# Patient Record
Sex: Male | Born: 1995 | Race: White | Hispanic: No | Marital: Single | Smoking: Current every day smoker
Health system: Southern US, Community
[De-identification: ages and names within clinical notes are randomized; demographics above are authoritative.]

## PROBLEM LIST (undated history)

## (undated) DIAGNOSIS — E785 Hyperlipidemia, unspecified: Secondary | ICD-10-CM

## (undated) DIAGNOSIS — J45909 Unspecified asthma, uncomplicated: Secondary | ICD-10-CM

## (undated) HISTORY — PX: OTHER SURGICAL HISTORY: SHX169

## (undated) HISTORY — PX: MOUTH SURGERY: SHX715

---

## 2014-08-09 ENCOUNTER — Emergency Department (INDEPENDENT_AMBULATORY_CARE_PROVIDER_SITE_OTHER): Payer: BC Managed Care – PPO

## 2014-08-09 ENCOUNTER — Emergency Department
Admission: EM | Admit: 2014-08-09 | Discharge: 2014-08-09 | Disposition: A | Discharge status: Home / Self Care | Payer: BC Managed Care – PPO | Source: Home / Self Care | Attending: Family Medicine | Admitting: Family Medicine

## 2014-08-09 ENCOUNTER — Encounter: Payer: Self-pay | Admitting: *Deleted

## 2014-08-09 DIAGNOSIS — R05 Cough: Secondary | ICD-10-CM | POA: Diagnosis not present

## 2014-08-09 DIAGNOSIS — R059 Cough, unspecified: Secondary | ICD-10-CM

## 2014-08-09 DIAGNOSIS — J189 Pneumonia, unspecified organism: Secondary | ICD-10-CM

## 2014-08-09 DIAGNOSIS — R0602 Shortness of breath: Secondary | ICD-10-CM | POA: Diagnosis not present

## 2014-08-09 DIAGNOSIS — R0600 Dyspnea, unspecified: Secondary | ICD-10-CM

## 2014-08-09 HISTORY — DX: Hyperlipidemia, unspecified: E78.5

## 2014-08-09 MED ORDER — BENZONATATE 200 MG PO CAPS: 200.0000 mg | ORAL_CAPSULE | Freq: Every day | ORAL | Status: DC

## 2014-08-09 MED ORDER — ALBUTEROL SULFATE HFA 108 (90 BASE) MCG/ACT IN AERS: 2.0000 | INHALATION_SPRAY | RESPIRATORY_TRACT | Status: DC | PRN

## 2014-08-09 MED ORDER — IPRATROPIUM-ALBUTEROL 0.5-2.5 (3) MG/3ML IN SOLN
3.0000 mL | Freq: Once | RESPIRATORY_TRACT | Status: AC
  Administered 2014-08-09: 3 mL via RESPIRATORY_TRACT

## 2014-08-09 MED ORDER — PREDNISONE 20 MG PO TABS: 20.0000 mg | ORAL_TABLET | Freq: Two times a day (BID) | ORAL | Status: DC

## 2014-08-09 MED ORDER — LEVOFLOXACIN 500 MG PO TABS: 500.0000 mg | ORAL_TABLET | Freq: Every day | ORAL | Status: DC

## 2014-08-09 NOTE — ED Notes (Signed)
Pt c/o SOB, congestion, sinus HA, and nonproductive cough x 4 days. Denies fever.

## 2014-08-09 NOTE — ED Provider Notes (Signed)
CSN: [id number]     Arrival date & time 08/09/14  1547 History   First MD Initiated Contact with Patient 08/09/14 1613     Chief Complaint  Patient presents with  . Cough  . Shortness of Breath      HPI Comments: Four to five days ago patient developed sinus congestion, fatigue, and myalgias.  He developed a non-productive cough two days ago, with wheezing and shortness of breath.  Today he developed a mild sore throat.  His cough is worse at night. He has a past history of asthma.  He continues to smoke.  The history is provided by the patient.    Past Medical History  Diagnosis Date  . Hyperlipidemia    Past Surgical History  Procedure Laterality Date  . Mouth surgery    . Ingrown toenail removal      x 6   Family History  Problem Relation Age of Onset  . Diabetes Mother    History  Substance Use Topics  . Smoking status: Current Every Day Smoker -- 1.00 packs/day    Types: Cigarettes  . Smokeless tobacco: Not on file  . Alcohol Use: No    Review of Systems + sore throat + cough No pleuritic pain + wheezing + nasal congestion + post-nasal drainage No sinus pain/pressure No itchy/red eyes No earache No hemoptysis + SOB + fever, + chills + nausea No vomiting No abdominal pain No diarrhea No urinary symptoms No skin rash + fatigue + myalgias + headache Used OTC meds without relief  Allergies  Review of patient's allergies indicates no known allergies.  Home Medications   Prior to Admission medications   Medication Sig Start Date End Date Taking? Authorizing Provider  albuterol (PROVENTIL HFA;VENTOLIN HFA) 108 (90 BASE) MCG/ACT inhaler Inhale 2 puffs into the lungs every 4 (four) hours as needed for wheezing or shortness of breath. 08/09/14   Lattie HawStephen A Beese, MD  benzonatate (TESSALON) 200 MG capsule Take 1 capsule (200 mg total) by mouth at bedtime. Take as needed for cough 08/09/14   Lattie HawStephen A Beese, MD  levofloxacin (LEVAQUIN) 500 MG tablet Take 1  tablet (500 mg total) by mouth daily. 08/09/14   Lattie HawStephen A Beese, MD  predniSONE (DELTASONE) 20 MG tablet Take 1 tablet (20 mg total) by mouth 2 (two) times daily. Take with food. 08/09/14   Lattie HawStephen A Beese, MD   BP 124/67 mmHg  Pulse 79  Temp(Src) 98.5 F (36.9 C) (Oral)  Resp 18  Wt 165 lb (74.844 kg)  SpO2 98% Physical Exam Nursing notes and Vital Signs reviewed. Appearance:  Patient appears healthy, stated age, and in no acute distress Eyes:  Pupils are equal, round, and reactive to light and accomodation.  Extraocular movement is intact.  Conjunctivae are not inflamed  Ears:  Canals normal.  Tympanic membranes normal.  Nose:  Mildly congested turbinates.  No sinus tenderness.  Pharynx:  Normal Neck:  Supple.   Tender enlarged posterior nodes are palpated bilaterally  Lungs:   Diffuse inspiratory/expiratory wheezes bilaterally.  Breath sounds are equal.  No respiratory distress.  Post neb treatment, decreased wheezes bilaterally Heart:  Regular rate and rhythm without murmurs, rubs, or gallops.  Abdomen:  Nontender without masses or hepatosplenomegaly.  Bowel sounds are present.  No CVA or flank tenderness.  Extremities:  No edema.  No calf tenderness Skin:  No rash present.   ED Course  Procedures  none    Imaging Review Dg Chest 2 View  08/09/2014   CLINICAL DATA:  Cough and shortness of breath. Chills. Initial encounter.  EXAM: CHEST  2 VIEW  COMPARISON:  None.  FINDINGS: Patchy right infrahilar opacity suggests pneumonia. Left lung is clear. No pleural effusion. The cardiopericardial silhouette is within normal limits for size. Imaged bony structures of the thorax are intact.  IMPRESSION: Patchy right infrahilar airspace disease suggests pneumonia.   Electronically Signed   By: Kennith CenterEric  Mansell M.D.   On: 08/09/2014 16:59     MDM   1. Pneumonia involving right lung, unspecified part of lung   2. Cough   3. Dyspnea    Administered DuoNeb by hand held nebulizer. Begin  Levaquin 500mg  daily for 10 days.  Prednisone burst.  Rx for albuterol MDI.  Prescription written for Benzonatate [facility](Tessalon) to take at bedtime for night-time cough.  Take plain Mucinex (1200 mg guaifenesin) twice daily for cough and congestion.  May add Sudafed for sinus congestion.   Increase fluid intake, rest. May use Afrin nasal spray (or generic oxymetazoline) twice daily for about 5 days.  Also recommend using saline nasal spray several times daily and saline nasal irrigation (AYR is a common brand) Try warm salt water gargles for sore throat.  Stop all antihistamines for now, and other non-prescription cough/cold preparations. If symptoms become significantly worse during the night or over the weekend, proceed to the local emergency room.  Followup with Family Doctor in 2 weeks for repeat chest x-ray    Lattie HawStephen A Beese, MD 08/11/14 1017

## 2014-08-09 NOTE — Discharge Instructions (Signed)
Take plain Mucinex (1200 mg guaifenesin) twice daily for cough and congestion.  May add Sudafed for sinus congestion.   Increase fluid intake, rest. May use Afrin nasal spray (or generic oxymetazoline) twice daily for about 5 days.  Also recommend using saline nasal spray several times daily and saline nasal irrigation (AYR is a common brand) Try warm salt water gargles for sore throat.  Stop all antihistamines for now, and other non-prescription cough/cold preparations. If symptoms become significantly worse during the night or over the weekend, proceed to the local emergency room.    Pneumonia Pneumonia is an infection of the lungs.  CAUSES Pneumonia may be caused by bacteria or a virus. Usually, these infections are caused by breathing infectious particles into the lungs (respiratory tract). SIGNS AND SYMPTOMS   Cough.  Fever.  Chest pain.  Increased rate of breathing.  Wheezing.  Mucus production. DIAGNOSIS  If you have the common symptoms of pneumonia, your health care provider will typically confirm the diagnosis with a chest X-ray. The X-ray will show an abnormality in the lung (pulmonary infiltrate) if you have pneumonia. Other tests of your blood, urine, or sputum may be done to find the specific cause of your pneumonia. Your health care provider may also do tests (blood gases or pulse oximetry) to see how well your lungs are working. TREATMENT  Some forms of pneumonia may be spread to other people when you cough or sneeze. You may be asked to wear a mask before and during your exam. Pneumonia that is caused by bacteria is treated with antibiotic medicine. Pneumonia that is caused by the influenza virus may be treated with an antiviral medicine. Most other viral infections must run their course. These infections will not respond to antibiotics.  HOME CARE INSTRUCTIONS   Cough suppressants may be used if you are losing too much rest. However, coughing protects you by clearing  your lungs. You should avoid using cough suppressants if you can.  Your health care provider may have prescribed medicine if he or she thinks your pneumonia is caused by bacteria or influenza. Finish your medicine even if you start to feel better.  Your health care provider may also prescribe an expectorant. This loosens the mucus to be coughed up.  Take medicines only as directed by your health care provider.  Do not smoke. Smoking is a common cause of bronchitis and can contribute to pneumonia. If you are a smoker and continue to smoke, your cough may last several weeks after your pneumonia has cleared.  A cold steam vaporizer or humidifier in your room or home may help loosen mucus.  Coughing is often worse at night. Sleeping in a semi-upright position in a recliner or using a couple pillows under your head will help with this.  Get rest as you feel it is needed. Your body will usually let you know when you need to rest. PREVENTION A pneumococcal shot (vaccine) is available to prevent a common bacterial cause of pneumonia. This is usually suggested for:  People over 19 years old.  Patients on chemotherapy.  People with chronic lung problems, such as bronchitis or emphysema.  People with immune system problems. If you are over 65 or have a high risk condition, you may receive the pneumococcal vaccine if you have not received it before. In some countries, a routine influenza vaccine is also recommended. This vaccine can help prevent some cases of pneumonia.You may be offered the influenza vaccine as part of your care. If  you smoke, it is time to quit. You may receive instructions on how to stop smoking. Your health care provider can provide medicines and counseling to help you quit. SEEK MEDICAL CARE IF: You have a fever. SEEK IMMEDIATE MEDICAL CARE IF:   Your illness becomes worse. This is especially true if you are elderly or weakened from any other disease.  You cannot control  your cough with suppressants and are losing sleep.  You begin coughing up blood.  You develop pain which is getting worse or is uncontrolled with medicines.  Any of the symptoms which initially brought you in for treatment are getting worse rather than better.  You develop shortness of breath or chest pain. MAKE SURE YOU:   Understand these instructions.  Will watch your condition.  Will get help right away if you are not doing well or get worse. Document Released: 08/23/2005 Document Revised: 01/07/2014 Document Reviewed: 11/12/2010 [facility] Patient Information 2015 [employer], [place]. This information is not intended to replace advice given to you by your health care provider. Make sure you discuss any questions you have with your health care provider.

## 2014-08-12 ENCOUNTER — Telehealth: Payer: Self-pay | Admitting: Emergency Medicine

## 2015-05-12 IMAGING — CR DG CHEST 2V
2 series · 2 of 2 positions shown · non-contrast
Comparison: None.

CLINICAL DATA: Cough and shortness of breath. Chills. Initial
encounter.

EXAM:
CHEST  2 VIEW

[view not recorded (1 of 2)]
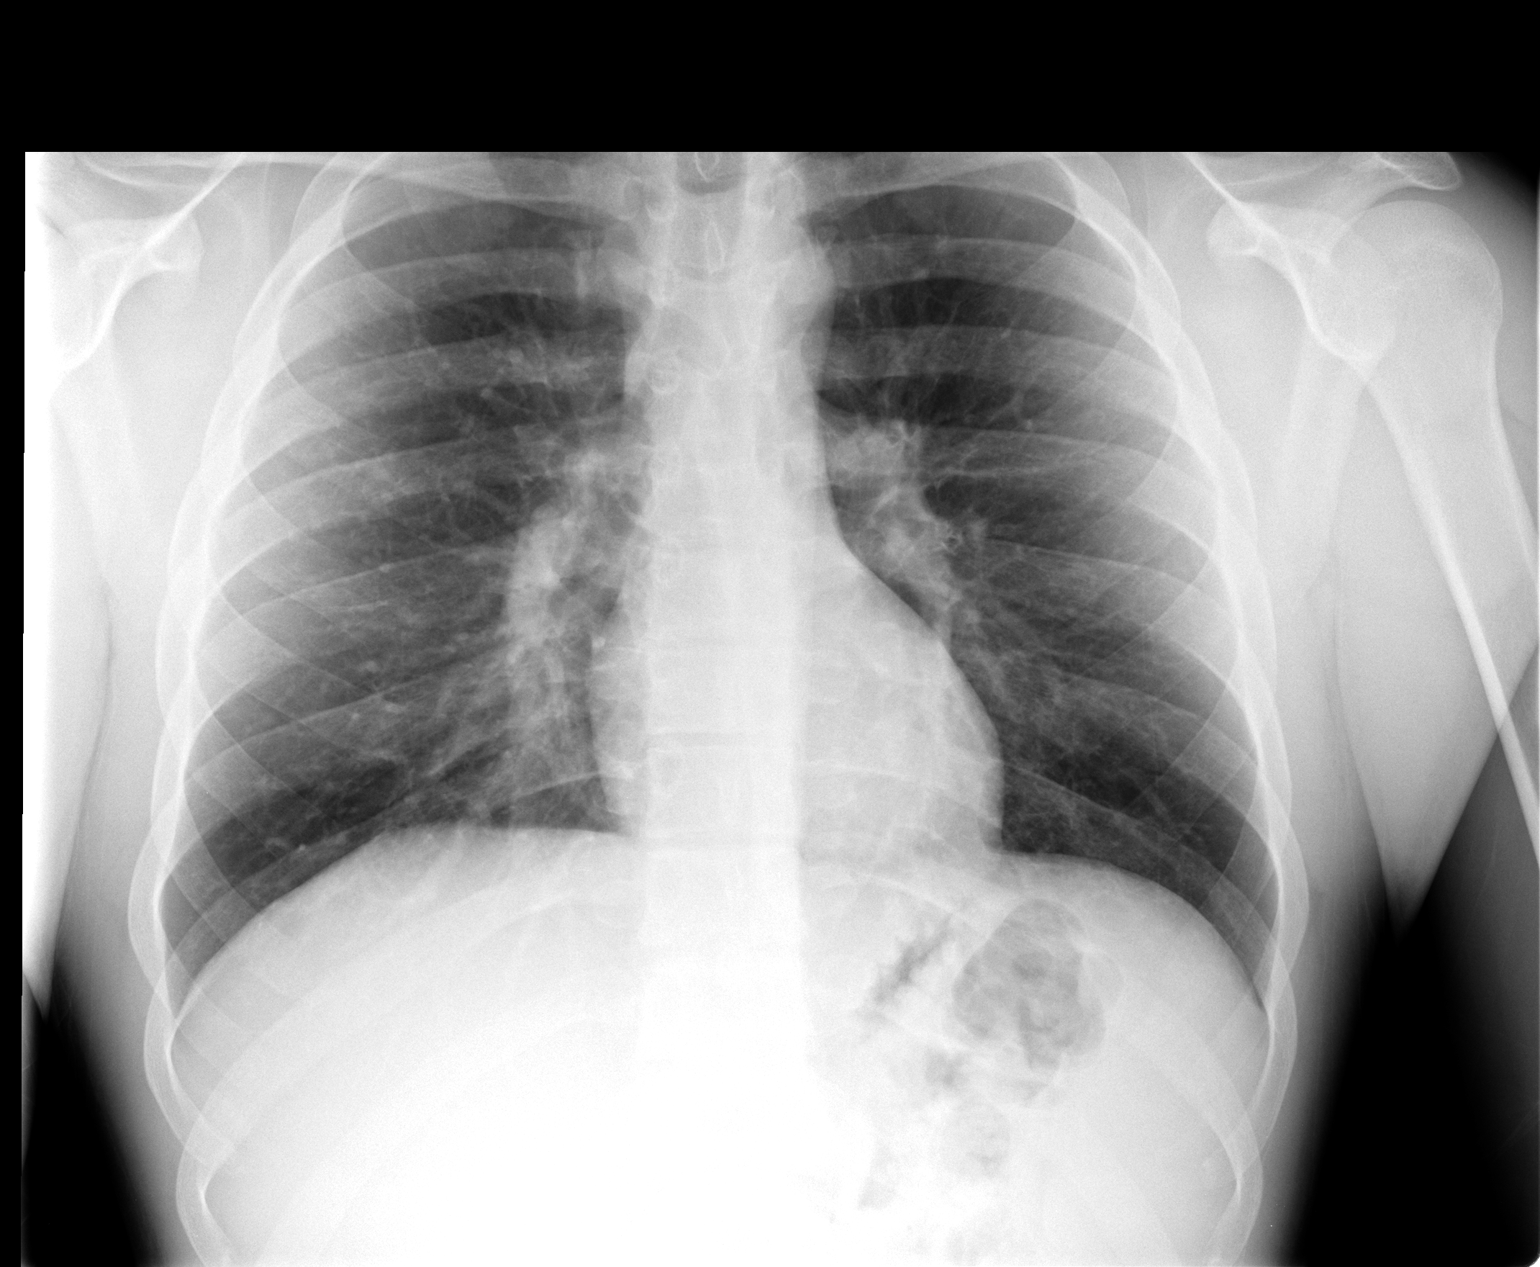

[view not recorded (2 of 2)]
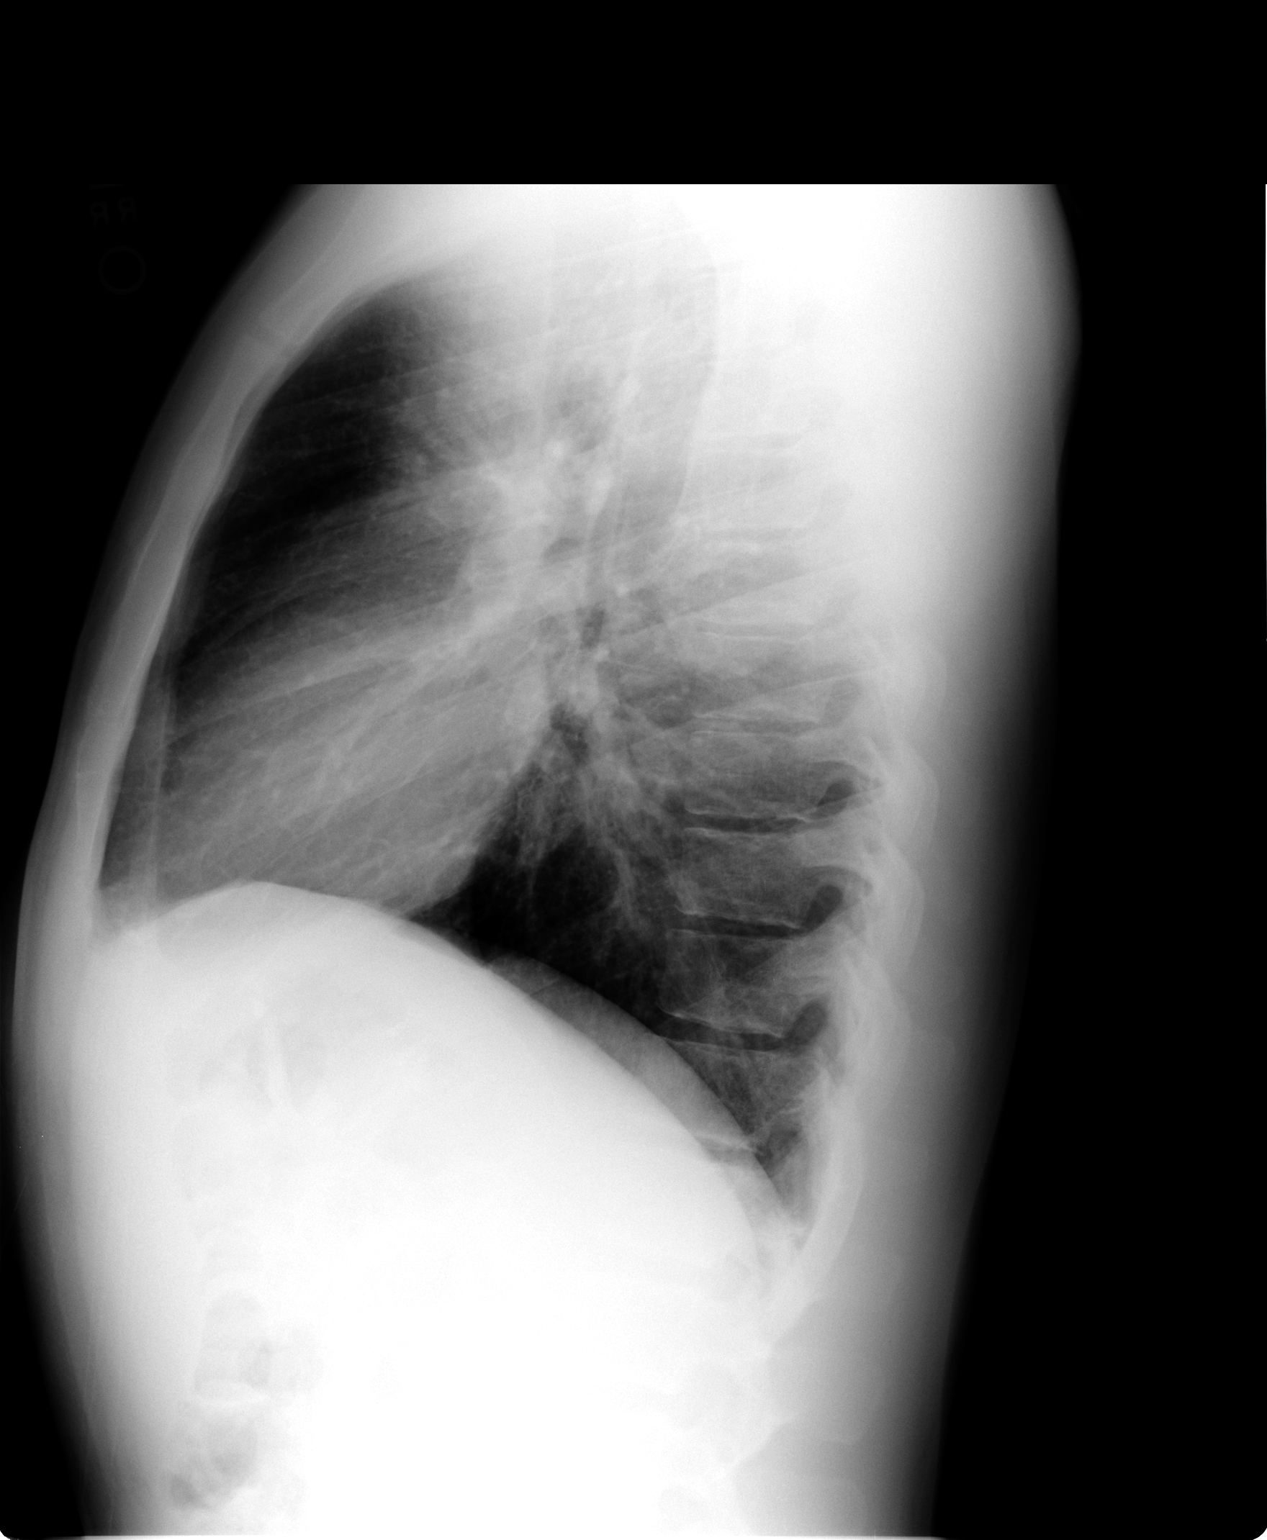

[2 of 2 positions shown; findings below may reference images not displayed]

FINDINGS: Patchy right infrahilar opacity suggests pneumonia. Left lung is
clear. No pleural effusion. The cardiopericardial silhouette is
within normal limits for size. Imaged bony structures of the thorax
are intact.
IMPRESSION: Patchy right infrahilar airspace disease suggests pneumonia.

## 2015-10-16 ENCOUNTER — Encounter: Payer: Self-pay | Admitting: *Deleted

## 2015-10-16 ENCOUNTER — Emergency Department (INDEPENDENT_AMBULATORY_CARE_PROVIDER_SITE_OTHER)
Admission: EM | Admit: 2015-10-16 | Discharge: 2015-10-16 | Disposition: A | Discharge status: Home / Self Care | Payer: BLUE CROSS/BLUE SHIELD | Source: Home / Self Care | Attending: Family Medicine | Admitting: Family Medicine

## 2015-10-16 DIAGNOSIS — J209 Acute bronchitis, unspecified: Secondary | ICD-10-CM | POA: Diagnosis not present

## 2015-10-16 DIAGNOSIS — J9801 Acute bronchospasm: Secondary | ICD-10-CM

## 2015-10-16 HISTORY — DX: Unspecified asthma, uncomplicated: J45.909

## 2015-10-16 MED ORDER — ALBUTEROL SULFATE HFA 108 (90 BASE) MCG/ACT IN AERS: 2.0000 | INHALATION_SPRAY | RESPIRATORY_TRACT | Status: AC | PRN

## 2015-10-16 MED ORDER — AZITHROMYCIN 250 MG PO TABS: ORAL_TABLET | ORAL | Status: AC

## 2015-10-16 MED ORDER — PREDNISONE 20 MG PO TABS: 20.0000 mg | ORAL_TABLET | Freq: Two times a day (BID) | ORAL | Status: AC

## 2015-10-16 NOTE — ED Notes (Signed)
Pt c/o sinus problems for 1 1/2 months. For the past 2 weeks dry cough and congestion and SOB even @ rest. H/o seasonal asthma. No inhaler @ home. Afebrile.

## 2015-10-16 NOTE — Discharge Instructions (Signed)
Take plain guaifenesin (  extended release tabs such as Mucinex) twice daily, with plenty of water, for cough and congestion.  May add Pseudoephedrine ( , one or two every 4 to 6 hours) for sinus congestion.  Get adequate rest.   May use Afrin nasal spray (or generic oxymetazoline) twice daily for about 5 days and then discontinue.  Also recommend using saline nasal spray several times daily and saline nasal irrigation (AYR is a common brand).  Use Flonase nasal spray each morning after using Afrin nasal spray and saline nasal irrigation. Try warm salt water gargles for sore throat.  Stop all antihistamines for now, and other non-prescription cough/cold preparations (such as Daquil, Nyquil, Alka-Seltzer plus, etc.) May take Delsym Cough Suppressant at bedtime for nighttime cough.    Follow-up with family doctor if not improving about 7 to10 days.

## 2015-10-16 NOTE — ED Provider Notes (Signed)
CSN: [id number]     Arrival date & time 10/16/15  1118 History   First MD Initiated Contact with Patient 10/16/15 1142     Chief Complaint  Patient presents with  . Cough  . Shortness of Breath      HPI Comments: Patient has had increased sinus congestion for about two months.  During the past two to three days he has had increased cough, shortness of breath and wheezing with activity, myalgias, and fatigue.  No fevers, chills, and sweats.  No pleuritic pain.  Cough has been non-productive and keeps him awake at night.   The history is provided by the patient.    Past Medical History  Diagnosis Date  . Hyperlipidemia   . Asthma    Past Surgical History  Procedure Laterality Date  . Mouth surgery    . Ingrown toenail removal      x 6   Family History  Problem Relation Age of Onset  . Diabetes Mother    Social History  Substance Use Topics  . Smoking status: Current Every Day Smoker -- 1.00 packs/day for 5 years    Types: Cigarettes  . Smokeless tobacco: None  . Alcohol Use: No    Review of Systems No sore throat + cough + sneezing No pleuritic pain + wheezing + nasal congestion + post-nasal drainage No sinus pain/pressure No itchy/red eyes No earache No hemoptysis + SOB with activity No fever/chills No nausea No vomiting No abdominal pain No diarrhea No urinary symptoms No skin rash + fatigue + myalgias No headache Used OTC meds without relief  Allergies  Review of patient's allergies indicates no known allergies.  Home Medications   Prior to Admission medications   Medication Sig Start Date End Date Taking? Authorizing Provider  albuterol (PROVENTIL HFA;VENTOLIN HFA) 108 (90 Base) MCG/ACT inhaler Inhale 2 puffs into the lungs every 4 (four) hours as needed for wheezing or shortness of breath. 10/16/15   Lattie Haw, MD  azithromycin (ZITHROMAX Z-PAK) 250 MG tablet Take 2 tabs today; then begin one tab once daily for 4 more days. 10/16/15   Lattie Haw, MD  predniSONE (DELTASONE) 20 MG tablet Take 1 tablet (20 mg total) by mouth 2 (two) times daily. Take with food. 10/16/15   Lattie Haw, MD   Meds Ordered and Administered this Visit  Medications - No data to display  BP 132/78 mmHg  Pulse 111  Temp(Src) 98.4 F (36.9 C) (Oral)  Resp 14  Wt 148 lb (67.132 kg)  SpO2 96% No data found.   Physical Exam Nursing notes and Vital Signs reviewed. Appearance:  Patient appears stated age, and in no acute distress Eyes:  Pupils are equal, round, and reactive to light and accomodation.  Extraocular movement is intact.  Conjunctivae are not inflamed  Ears:  Canals normal.  Tympanic membranes normal.  Nose:  Mildly congested turbinates.  No sinus tenderness.   Pharynx:  Normal Neck:  Supple.  Tender enlarged posterior nodes are palpated bilaterally  Lungs:  Diffuse expiratory wheezes all fields; no rales or rhonchi.  Breath sounds are equal.  Moving air well. Heart:  Regular rate and rhythm without murmurs, rubs, or gallops.  Abdomen:  Nontender without masses or hepatosplenomegaly.  Bowel sounds are present.  No CVA or flank tenderness.  Extremities:  No edema.  Skin:  No rash present.   ED Course  Procedures  None    MDM   1. Acute bronchitis, unspecified organism  2. Bronchospasm    Begin Z-pak for atypical coverage, and prednisone burst. Rx for albuterol inhaler. Take plain guaifenesin (  extended release tabs such as Mucinex) twice daily, with plenty of water, for cough and congestion.  May add Pseudoephedrine ( , one or two every 4 to 6 hours) for sinus congestion.  Get adequate rest.   May use Afrin nasal spray (or generic oxymetazoline) twice daily for about 5 days and then discontinue.  Also recommend using saline nasal spray several times daily and saline nasal irrigation (AYR is a common brand).  Use Flonase nasal spray each morning after using Afrin nasal spray and saline nasal irrigation. Try warm salt  water gargles for sore throat.  Stop all antihistamines for now, and other non-prescription cough/cold preparations (such as Daquil, Nyquil, Alka-Seltzer plus, etc.) May take Delsym Cough Suppressant at bedtime for nighttime cough.  Recommend follow-up with PCP for asthma management.   Follow-up with family doctor if not improving about 7 to10 days.     Lattie Haw, MD 10/16/15 [phone]
